# Patient Record
Sex: Female | Born: 1970 | Hispanic: No | Marital: Married | Smoking: Never smoker
Health system: Southern US, Community
[De-identification: ages and names within clinical notes are randomized; demographics above are authoritative.]

## PROBLEM LIST (undated history)

## (undated) HISTORY — PX: HIP SURGERY: SHX245

---

## 2006-04-17 ENCOUNTER — Other Ambulatory Visit: Admission: RE | Admit: 2006-04-17 | Discharge: 2006-04-17 | Payer: Self-pay | Admitting: Family Medicine

## 2010-04-13 ENCOUNTER — Other Ambulatory Visit (HOSPITAL_COMMUNITY)
Admission: RE | Admit: 2010-04-13 | Discharge: 2010-04-13 | Disposition: A | Discharge status: Home / Self Care | Payer: BC Managed Care – PPO | Source: Ambulatory Visit | Attending: Family Medicine | Admitting: Family Medicine

## 2010-04-13 DIAGNOSIS — Z124 Encounter for screening for malignant neoplasm of cervix: Secondary | ICD-10-CM | POA: Insufficient documentation

## 2011-03-24 ENCOUNTER — Other Ambulatory Visit (HOSPITAL_COMMUNITY)
Admission: RE | Admit: 2011-03-24 | Discharge: 2011-03-24 | Disposition: A | Discharge status: Home / Self Care | Payer: BC Managed Care – PPO | Source: Ambulatory Visit | Attending: Family Medicine | Admitting: Family Medicine

## 2011-03-24 DIAGNOSIS — Z124 Encounter for screening for malignant neoplasm of cervix: Secondary | ICD-10-CM | POA: Insufficient documentation

## 2014-09-29 ENCOUNTER — Other Ambulatory Visit (HOSPITAL_COMMUNITY)
Admission: RE | Admit: 2014-09-29 | Discharge: 2014-09-29 | Disposition: A | Discharge status: Home / Self Care | Payer: BLUE CROSS/BLUE SHIELD | Source: Ambulatory Visit | Attending: Family Medicine | Admitting: Family Medicine

## 2014-09-29 ENCOUNTER — Other Ambulatory Visit: Payer: Self-pay | Admitting: Family Medicine

## 2014-09-29 DIAGNOSIS — Z01411 Encounter for gynecological examination (general) (routine) with abnormal findings: Secondary | ICD-10-CM | POA: Insufficient documentation

## 2014-09-29 DIAGNOSIS — Z1151 Encounter for screening for human papillomavirus (HPV): Secondary | ICD-10-CM | POA: Insufficient documentation

## 2014-10-01 LAB — CYTOLOGY - PAP

## 2015-02-17 ENCOUNTER — Emergency Department (HOSPITAL_COMMUNITY): Payer: BLUE CROSS/BLUE SHIELD

## 2015-02-17 ENCOUNTER — Emergency Department (HOSPITAL_COMMUNITY)
Admission: EM | Admit: 2015-02-17 | Discharge: 2015-02-17 | Disposition: A | Discharge status: Home / Self Care | Payer: BLUE CROSS/BLUE SHIELD | Attending: Emergency Medicine | Admitting: Emergency Medicine

## 2015-02-17 ENCOUNTER — Encounter (HOSPITAL_COMMUNITY): Payer: Self-pay | Admitting: Emergency Medicine

## 2015-02-17 DIAGNOSIS — J40 Bronchitis, not specified as acute or chronic: Secondary | ICD-10-CM

## 2015-02-17 DIAGNOSIS — R000 Tachycardia, unspecified: Secondary | ICD-10-CM | POA: Insufficient documentation

## 2015-02-17 DIAGNOSIS — Z91040 Latex allergy status: Secondary | ICD-10-CM | POA: Insufficient documentation

## 2015-02-17 DIAGNOSIS — J209 Acute bronchitis, unspecified: Secondary | ICD-10-CM | POA: Insufficient documentation

## 2015-02-17 LAB — BASIC METABOLIC PANEL
Anion gap: 11 (ref 5–15)
BUN: 9 mg/dL (ref 6–20)
CHLORIDE: 98 mmol/L — AB (ref 101–111)
CO2: 30 mmol/L (ref 22–32)
CREATININE: 0.92 mg/dL (ref 0.44–1.00)
Calcium: 9.2 mg/dL (ref 8.9–10.3)
GFR calc Af Amer: >60 mL/min (ref >60)
GFR calc non Af Amer: >60 mL/min (ref >60)
GLUCOSE: 96 mg/dL (ref 65–99)
POTASSIUM: 3.7 mmol/L (ref 3.5–5.1)
SODIUM: 139 mmol/L (ref 135–145)

## 2015-02-17 LAB — CBC
HEMATOCRIT: 40.9 % (ref 36.0–46.0)
Hemoglobin: 13 g/dL (ref 12.0–15.0)
MCH: 28.3 pg (ref 26.0–34.0)
MCHC: 31.8 g/dL (ref 30.0–36.0)
MCV: 89.1 fL (ref 78.0–100.0)
PLATELETS: 196 10*3/uL (ref 150–400)
RBC: 4.59 MIL/uL (ref 3.87–5.11)
RDW: 14.1 % (ref 11.5–15.5)
WBC: 4.9 10*3/uL (ref 4.0–10.5)

## 2015-02-17 LAB — I-STAT TROPONIN, ED: Troponin i, poc: 0 ng/mL (ref 0.00–0.08)

## 2015-02-17 MED ORDER — IPRATROPIUM BROMIDE 0.02 % IN SOLN
0.5000 mg | Freq: Once | RESPIRATORY_TRACT | Status: AC
  Administered 2015-02-17: 0.5 mg via RESPIRATORY_TRACT
  Filled 2015-02-17: qty 2.5

## 2015-02-17 MED ORDER — DEXAMETHASONE 2 MG PO TABS: 10.0000 mg | ORAL_TABLET | Freq: Two times a day (BID) | ORAL | Status: AC

## 2015-02-17 MED ORDER — ALBUTEROL SULFATE (2.5 MG/3ML) 0.083% IN NEBU
5.0000 mg | INHALATION_SOLUTION | Freq: Once | RESPIRATORY_TRACT | Status: AC
  Administered 2015-02-17: 5 mg via RESPIRATORY_TRACT
  Filled 2015-02-17: qty 6

## 2015-02-17 MED ORDER — ACETAMINOPHEN 325 MG PO TABS
650.0000 mg | ORAL_TABLET | Freq: Once | ORAL | Status: AC
  Administered 2015-02-17: 650 mg via ORAL
  Filled 2015-02-17: qty 2

## 2015-02-17 MED ORDER — AZITHROMYCIN 250 MG PO TABS: 250.0000 mg | ORAL_TABLET | Freq: Every day | ORAL | Status: AC

## 2015-02-17 MED ORDER — AEROCHAMBER PLUS FLO-VU MEDIUM MISC
1.0000 | Freq: Once | Status: AC
  Administered 2015-02-17: 1
  Filled 2015-02-17: qty 1

## 2015-02-17 MED ORDER — ALBUTEROL (5 MG/ML) CONTINUOUS INHALATION SOLN
10.0000 mg/h | INHALATION_SOLUTION | Freq: Once | RESPIRATORY_TRACT | Status: AC
  Administered 2015-02-17: 10 mg/h via RESPIRATORY_TRACT
  Filled 2015-02-17: qty 20

## 2015-02-17 MED ORDER — ALBUTEROL SULFATE HFA 108 (90 BASE) MCG/ACT IN AERS
1.0000 | INHALATION_SPRAY | RESPIRATORY_TRACT | Status: DC | PRN
  Administered 2015-02-17: 2 via RESPIRATORY_TRACT
  Filled 2015-02-17: qty 6.7

## 2015-02-17 MED ORDER — DEXAMETHASONE 4 MG PO TABS
10.0000 mg | ORAL_TABLET | Freq: Once | ORAL | Status: AC
  Administered 2015-02-17: 10 mg via ORAL
  Filled 2015-02-17: qty 3

## 2015-02-17 NOTE — Discharge Instructions (Signed)
You were seen today for your chest pain and shortness of breath as well as her cough. It appears that you have bronchitis which was causing you to wheeze. Use the inhaler as instructed.  Take the antibiotic and steroid as prescribed. Return with worsening symptoms.  Acute Bronchitis Bronchitis is inflammation of the airways that extend from the windpipe into the lungs (bronchi). The inflammation often causes mucus to develop. This leads to a cough, which is the most common symptom of bronchitis.  In acute bronchitis, the condition usually develops suddenly and goes away over time, usually in a couple weeks. Smoking, allergies, and asthma can make bronchitis worse. Repeated episodes of bronchitis may cause further lung problems.  CAUSES Acute bronchitis is most often caused by the same virus that causes a cold. The virus can spread from person to person (contagious) through coughing, sneezing, and touching contaminated objects. SIGNS AND SYMPTOMS   Cough.   Fever.   Coughing up mucus.   Body aches.   Chest congestion.   Chills.   Shortness of breath.   Sore throat.  DIAGNOSIS  Acute bronchitis is usually diagnosed through a physical exam. Your health care provider will also ask you questions about your medical history. Tests, such as chest X-rays, are sometimes done to rule out other conditions.  TREATMENT  Acute bronchitis usually goes away in a couple weeks. Oftentimes, no medical treatment is necessary. Medicines are sometimes given for relief of fever or cough. Antibiotic medicines are usually not needed but may be prescribed in certain situations. In some cases, an inhaler may be recommended to help reduce shortness of breath and control the cough. A cool mist vaporizer may also be used to help thin bronchial secretions and make it easier to clear the chest.  HOME CARE INSTRUCTIONS  Get plenty of rest.   Drink enough fluids to keep your urine clear or pale yellow (unless  you have a medical condition that requires fluid restriction). Increasing fluids may help thin your respiratory secretions (sputum) and reduce chest congestion, and it will prevent dehydration.   Take medicines only as directed by your health care provider.  If you were prescribed an antibiotic medicine, finish it all even if you start to feel better.  Avoid smoking and secondhand smoke. Exposure to cigarette smoke or irritating chemicals will make bronchitis worse. If you are a smoker, consider using nicotine gum or skin patches to help control withdrawal symptoms. Quitting smoking will help your lungs heal faster.   Reduce the chances of another bout of acute bronchitis by washing your hands frequently, avoiding people with cold symptoms, and trying not to touch your hands to your mouth, nose, or eyes.   Keep all follow-up visits as directed by your health care provider.  SEEK MEDICAL CARE IF: Your symptoms do not improve after 1 week of treatment.  SEEK IMMEDIATE MEDICAL CARE IF:  You develop an increased fever or chills.   You have chest pain.   You have severe shortness of breath.  You have bloody sputum.   You develop dehydration.  You faint or repeatedly feel like you are going to pass out.  You develop repeated vomiting.  You develop a severe headache. MAKE SURE YOU:   Understand these instructions.  Will watch your condition.  Will get help right away if you are not doing well or get worse.   This information is not intended to replace advice given to you by your health care provider. Make sure you  discuss any questions you have with your health care provider.   Document Released: 03/16/2004 Document Revised: 02/27/2014 Document Reviewed: 07/30/2012 Elsevier Interactive Patient Education [employer]

## 2015-02-17 NOTE — ED Notes (Signed)
MD Nguyen at the bedside.

## 2015-02-17 NOTE — ED Notes (Signed)
Breathing treatment complete 

## 2015-02-17 NOTE — ED Notes (Signed)
Pt to Xray.

## 2015-02-17 NOTE — ED Provider Notes (Signed)
CSN: [id number]     Arrival date & time 02/17/15  0018 History   By signing my name below, I, Arlan Organ, attest that this documentation has been prepared under the direction and in the presence of Leta Baptist, MD.  Electronically Signed: Arlan Organ, ED Scribe. 02/17/2015. 1:13 AM.   Chief Complaint  Patient presents with  . Chest Pain  . Shortness of Breath   The history is provided by the patient. No language interpreter was used.    HPI Comments: Casi Westerfeld is a 44 y.o. female without any pertinent past medical history who presents to the Emergency Department complaining of constant, ongoing central chest pain x 2 days. Pain is described as tightness. She also reported shortness of breath, productive, cough, and discomfort to the L arm and L shoulder. No aggravating or alleviating factors at this time. OTC Tylenol cold and Mucinex attempted prior to arrival with mild improvement for symptoms. No recent fever, chills, nausea, or vomiting. Pt states her nephew was been sick at home with similar symptoms.  PCP: No primary care provider on file.    History reviewed. No pertinent past medical history. Past Surgical History  Procedure Laterality Date  . Hip surgery     No family history on file. Social History  Substance Use Topics  . Smoking status: Never Smoker   . Smokeless tobacco: None  . Alcohol Use: Yes   OB History    No data available     Review of Systems  A complete 10 system review of systems was obtained and all systems are negative except as noted in the HPI and PMH.    Allergies  Latex  Home Medications   Prior to Admission medications   Not on File   Triage Vitals: BP 157/101 mmHg  Pulse 105  Temp(Src) 98.8 F (37.1 C) (Oral)  Resp 20  SpO2 94%  LMP 02/06/2015 (Approximate)   Physical Exam  Constitutional: She is oriented to person, place, and time. She appears well-developed and well-nourished. No distress.  HENT:  Head: Normocephalic  and atraumatic.  Right Ear: External ear normal.  Left Ear: External ear normal.  Nose: Nose normal.  Mouth/Throat: Oropharynx is clear and moist. No oropharyngeal exudate.  Eyes: EOM are normal. Pupils are equal, round, and reactive to light.  Neck: Normal range of motion. Neck supple.  Cardiovascular: Regular rhythm, normal heart sounds and intact distal pulses.  Tachycardia present.   No murmur heard. Pulmonary/Chest: Effort normal. No respiratory distress. She has wheezes (diffuse, bilateral). She has no rales.  Abdominal: Soft. She exhibits no distension. There is no tenderness.  Musculoskeletal: Normal range of motion. She exhibits no edema or tenderness.  Neurological: She is alert and oriented to person, place, and time.  Skin: Skin is warm and dry. No rash noted. She is not diaphoretic.  Vitals reviewed.   ED Course  Procedures (including critical care time)  DIAGNOSTIC STUDIES: Oxygen Saturation is 94% on RA, adequate by my interpretation.    COORDINATION OF CARE: 1:10 AM- Will order CXR, BMP, CBC, i-stat troponin, and EKG. Discussed treatment plan with pt at bedside and pt agreed to plan.     Labs Review Labs Reviewed  BASIC METABOLIC PANEL - Abnormal; Notable for the following:    Chloride 98 (*)    All other components within normal limits  CBC  I-STAT TROPOININ, ED    Imaging Review Dg Chest 2 View  02/17/2015  CLINICAL DATA:  Acute onset of  central chest tightness and shortness of breath. Productive cough. Left arm and left shoulder discomfort. Initial encounter. EXAM: CHEST  2 VIEW COMPARISON:  None. FINDINGS: The lungs are well-aerated. Mild vascular congestion is noted. There is no evidence of focal opacification, pleural effusion or pneumothorax. The heart is normal in size; the mediastinal contour is within normal limits. No acute osseous abnormalities are seen. IMPRESSION: Mild vascular congestion noted.  Lungs remain grossly clear. Electronically Signed    By: Roanna RaiderJeffery  Chang M.D.   On: 02/17/2015 01:22   I have personally reviewed and evaluated these images and lab results as part of my medical decision-making.   EKG Interpretation   Date/Time:  Wednesday February 17 2015 00:21:05 EST Ventricular Rate:  110 PR Interval:  144 QRS Duration: 76 QT Interval:  350 QTC Calculation: 473 R Axis:   79 Text Interpretation:  Sinus tachycardia with occasional Premature  ventricular complexes Nonspecific T wave abnormality Abnormal ECG No  previous ECGs available Confirmed by Jovon Winterhalter ([id number]) on 02/17/2015  1:02:05 AM      MDM  Patient was seen and evaluated in stable condition.  Examination with diffuse wheezing.  Infectious symptoms on history and exam.  Non specific findings on EKG.  Troponin normal.  Mild congestion on chest xray.  Patient given Decadron and multiple breathing treatments with significant improvement in symptoms and examination.  Patient refused IV for hydration.  Patient tolerating PO.  Patient was discharged home with prescriptions for Azithromycin, decadron, albuterol.  Supportive measures explained.  Patient discharged home in stable condition with strict return precautions. Final diagnoses:  None    1. Acute bronchitis  I personally performed the services described in this documentation, which was scribed in my presence. The recorded information has been reviewed and is accurate.  Leta BaptistEmily Roe Zyanna Leisinger, MD 02/19/15 [phone]

## 2015-02-17 NOTE — ED Notes (Signed)
Pt. reports central chest tightness with SOB , productive cough , left arm and left shoulder discomfort onset yesterday .

## 2017-11-02 ENCOUNTER — Other Ambulatory Visit: Payer: Self-pay | Admitting: Family Medicine

## 2017-11-02 DIAGNOSIS — E049 Nontoxic goiter, unspecified: Secondary | ICD-10-CM

## 2017-11-07 ENCOUNTER — Other Ambulatory Visit: Payer: BLUE CROSS/BLUE SHIELD

## 2017-11-13 ENCOUNTER — Other Ambulatory Visit: Payer: BLUE CROSS/BLUE SHIELD

## 2017-11-22 ENCOUNTER — Ambulatory Visit
Admission: RE | Admit: 2017-11-22 | Discharge: 2017-11-22 | Disposition: A | Discharge status: Home / Self Care | Payer: BLUE CROSS/BLUE SHIELD | Source: Ambulatory Visit | Attending: Family Medicine | Admitting: Family Medicine

## 2017-11-22 DIAGNOSIS — E049 Nontoxic goiter, unspecified: Secondary | ICD-10-CM

## 2020-04-18 IMAGING — US US THYROID
1 series · 13 of 25 positions shown · non-contrast
Comparison: None.

CLINICAL DATA: Goiter on exam

EXAM:
THYROID ULTRASOUND
TECHNIQUE: Ultrasound examination of the thyroid gland and adjacent soft
tissues was performed.

[Series 1: us thyroid · 0.08mm/px · 13 of 54 slices shown]
[im 1/54]
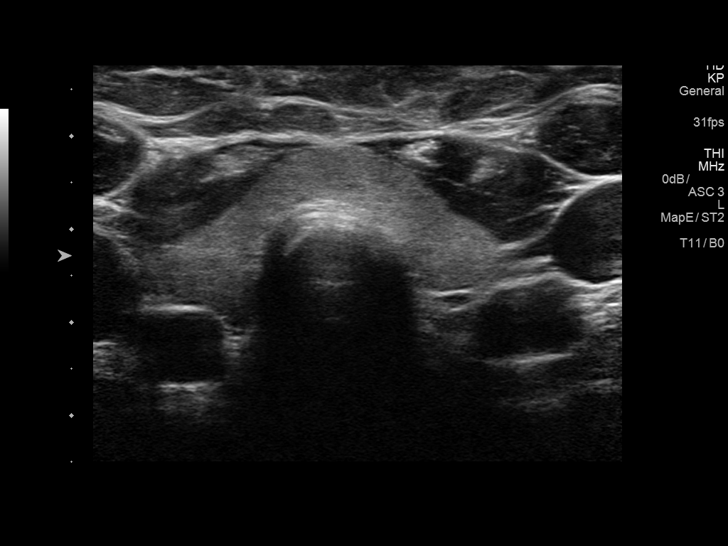
[im 5/54]
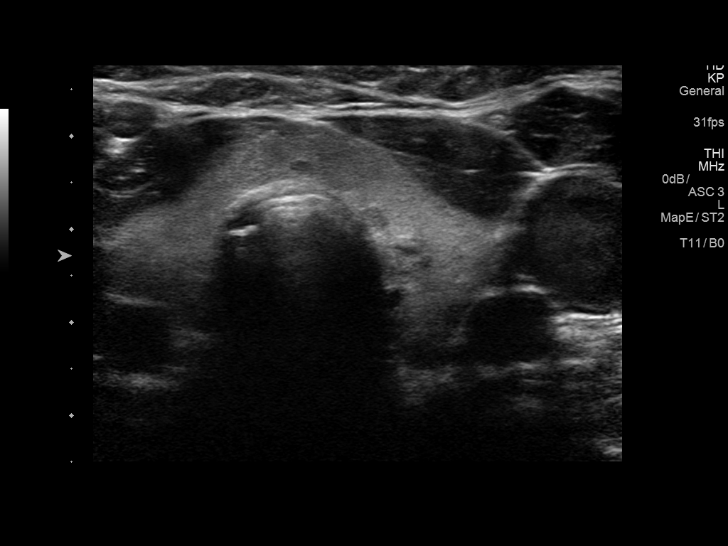
[im 9/54]
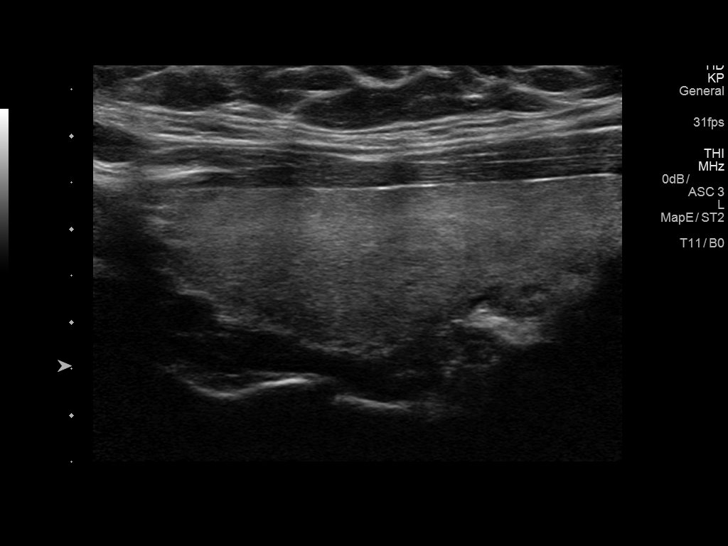
[im 14/54]
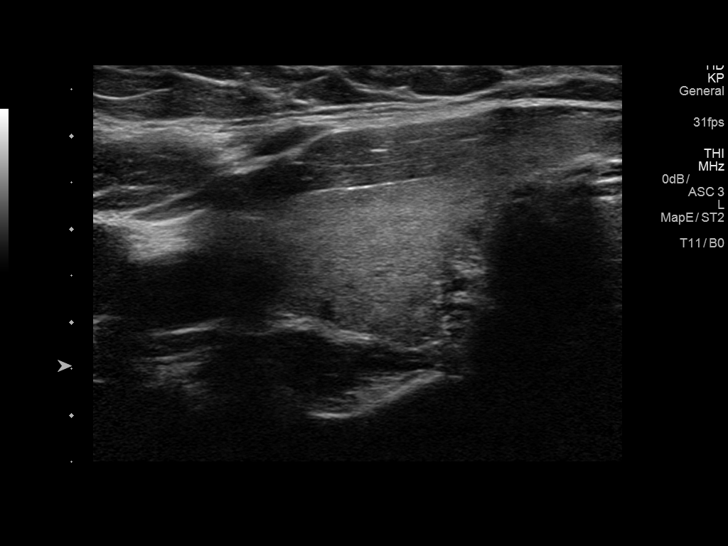
[im 18/54]
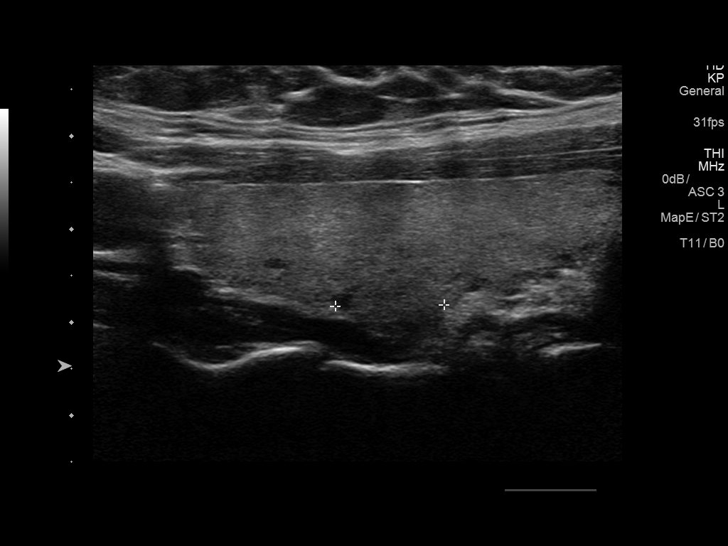
[im 23/54]
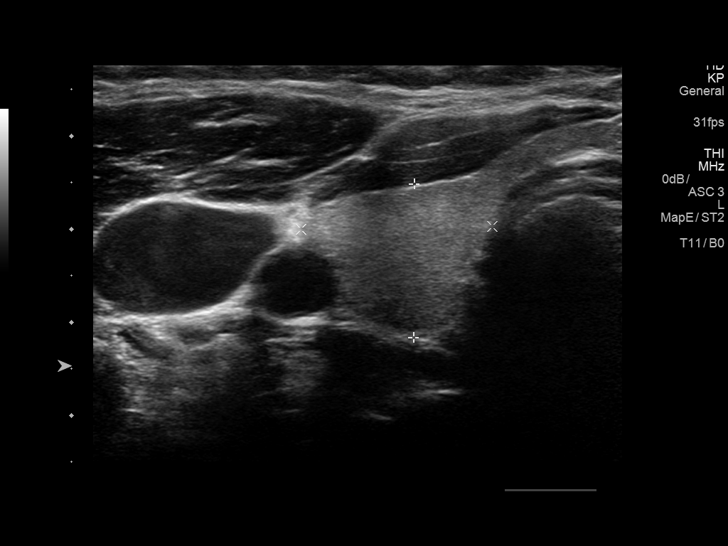
[im 27/54]
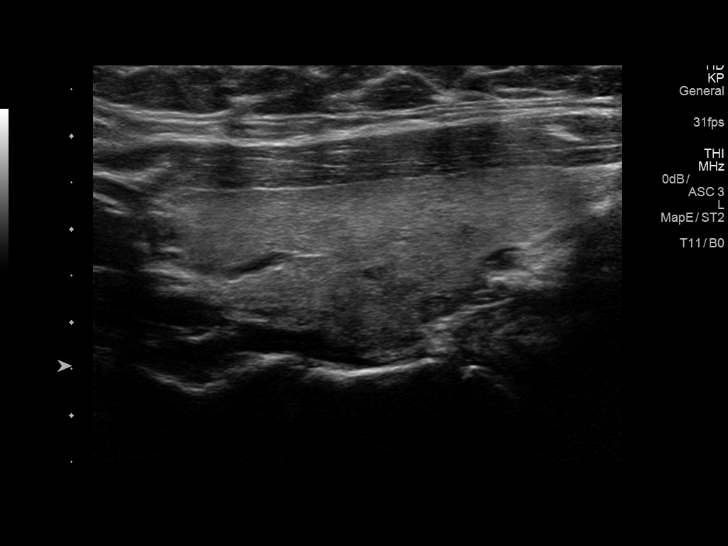
[im 31/54]
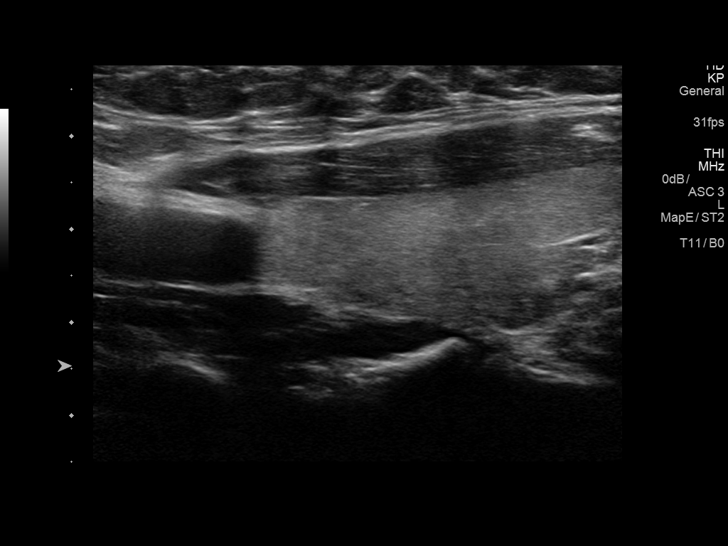
[im 36/54]
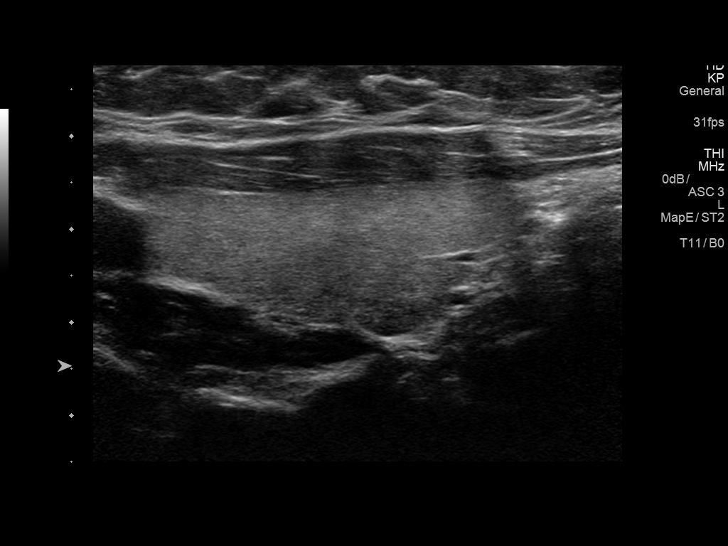
[im 40/54]
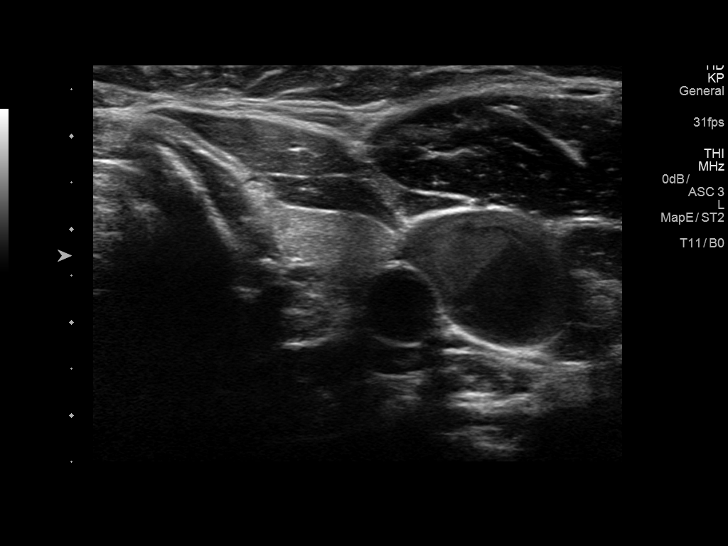
[im 45/54]
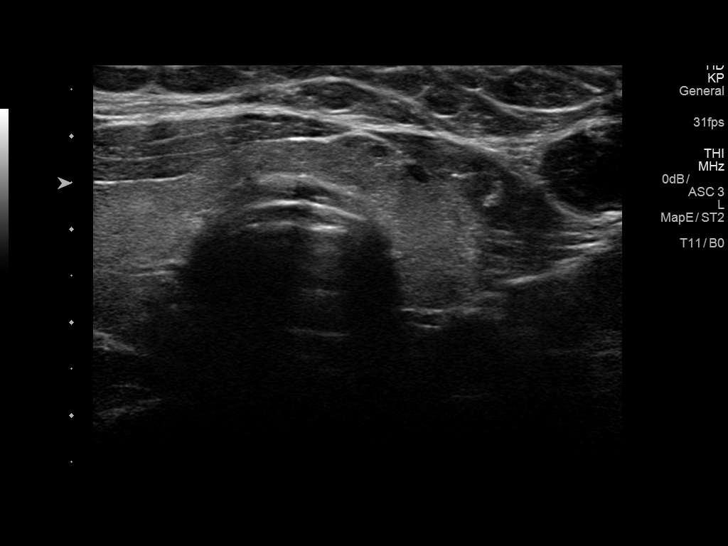
[im 49/54]
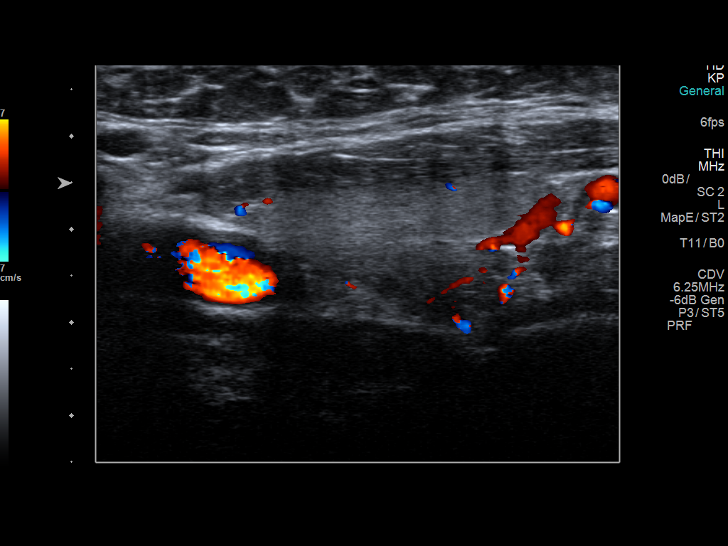
[im 54/54]
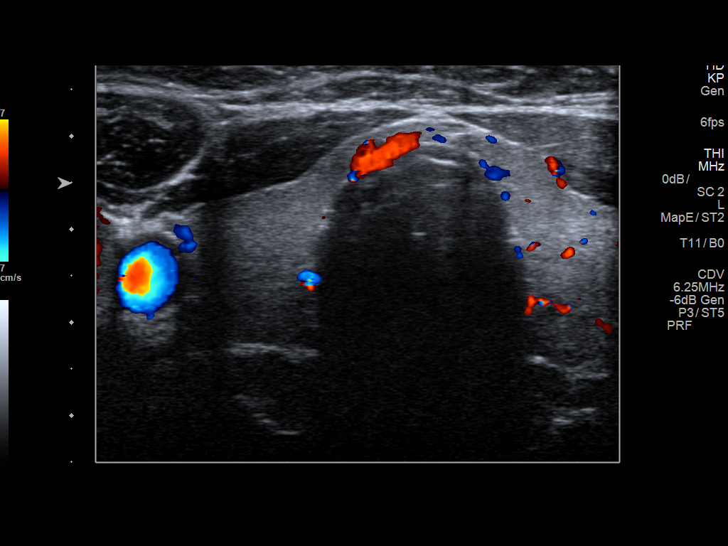

[13 of 25 positions shown; findings below may reference images not displayed]

FINDINGS: Parenchymal Echotexture: Mildly heterogenous

Isthmus: 6 mm

Right lobe: 5.7 x 1.7 x 2.1 cm

Left lobe: 4.9 x 1.7 x 1.9 cm

_________________________________________________________

Estimated total number of nodules >/= 1 cm: 2

Number of spongiform nodules >/=  2 cm not described below (TR1): 0

Number of mixed cystic and solid nodules >/= 1.5 cm not described
below (TR2): 0

_________________________________________________________

Minimally nodular areas noted of the inferior posterior thyroid
lobes bilaterally measuring 12 mm on the right and 13 mm on the
left. These may represent areas of pseudo nodularity versus
parathyroids.

Tiny hypoechoic cysts noted in the isthmus, measuring 3 mm. These
would not meet criteria for any follow-up for biopsy.

No adenopathy.
IMPRESSION: No significant thyroid abnormality that warrants follow-up.

Subtle nodularity of the inferior posterior thyroid lobes
bilaterally as above may represent pseudo nodularity versus
parathyroids.

No discrete thyroid nodule.

The above is in keeping with the ACR TI-RADS recommendations - [HOSPITAL] 6417;[DATE].
